# Patient Record
Sex: Female | Born: 1980 | Race: White | Hispanic: Yes | Marital: Married
Health system: Southern US, Community
[De-identification: ages and names within clinical notes are randomized; demographics above are authoritative.]

---

## 1999-12-27 ENCOUNTER — Ambulatory Visit (HOSPITAL_COMMUNITY): Admission: RE | Admit: 1999-12-27 | Discharge: 1999-12-27 | Payer: Self-pay | Admitting: *Deleted

## 2000-01-24 ENCOUNTER — Inpatient Hospital Stay (HOSPITAL_COMMUNITY): Admission: AD | Admit: 2000-01-24 | Discharge: 2000-01-25 | Payer: Self-pay | Admitting: *Deleted

## 2002-04-15 ENCOUNTER — Inpatient Hospital Stay (HOSPITAL_COMMUNITY): Admission: AD | Admit: 2002-04-15 | Discharge: 2002-04-15 | Payer: Self-pay | Admitting: Obstetrics and Gynecology

## 2002-04-24 ENCOUNTER — Emergency Department (HOSPITAL_COMMUNITY): Admission: EM | Admit: 2002-04-24 | Discharge: 2002-04-25 | Payer: Self-pay | Admitting: Emergency Medicine

## 2002-04-24 ENCOUNTER — Encounter: Payer: Self-pay | Admitting: Emergency Medicine

## 2002-06-23 ENCOUNTER — Ambulatory Visit (HOSPITAL_COMMUNITY): Admission: RE | Admit: 2002-06-23 | Discharge: 2002-06-23 | Payer: Self-pay | Admitting: *Deleted

## 2002-11-23 ENCOUNTER — Inpatient Hospital Stay (HOSPITAL_COMMUNITY): Admission: AD | Admit: 2002-11-23 | Discharge: 2002-11-26 | Payer: Self-pay | Admitting: Family Medicine

## 2003-03-21 ENCOUNTER — Emergency Department (HOSPITAL_COMMUNITY): Admission: EM | Admit: 2003-03-21 | Discharge: 2003-03-21 | Payer: Self-pay

## 2003-05-29 ENCOUNTER — Inpatient Hospital Stay (HOSPITAL_COMMUNITY): Admission: EM | Admit: 2003-05-29 | Discharge: 2003-06-01 | Payer: Self-pay | Admitting: Emergency Medicine

## 2003-05-29 ENCOUNTER — Encounter: Payer: Self-pay | Admitting: Emergency Medicine

## 2003-05-30 ENCOUNTER — Encounter: Payer: Self-pay | Admitting: Emergency Medicine

## 2003-06-09 ENCOUNTER — Encounter: Admission: RE | Admit: 2003-06-09 | Discharge: 2003-06-09 | Payer: Self-pay | Admitting: Family Medicine

## 2004-09-14 ENCOUNTER — Ambulatory Visit: Payer: Self-pay | Admitting: Family Medicine

## 2004-09-14 ENCOUNTER — Ambulatory Visit: Payer: Self-pay | Admitting: *Deleted

## 2005-05-14 ENCOUNTER — Ambulatory Visit: Payer: Self-pay | Admitting: Family Medicine

## 2005-10-29 ENCOUNTER — Ambulatory Visit: Payer: Self-pay | Admitting: Internal Medicine

## 2006-01-03 ENCOUNTER — Ambulatory Visit: Payer: Self-pay | Admitting: Internal Medicine

## 2006-03-26 ENCOUNTER — Emergency Department (HOSPITAL_COMMUNITY): Admission: EM | Admit: 2006-03-26 | Discharge: 2006-03-26 | Payer: Self-pay | Admitting: Emergency Medicine

## 2006-03-28 ENCOUNTER — Ambulatory Visit (HOSPITAL_COMMUNITY): Admission: RE | Admit: 2006-03-28 | Discharge: 2006-03-28 | Payer: Self-pay | Admitting: Obstetrics & Gynecology

## 2006-04-10 ENCOUNTER — Ambulatory Visit (HOSPITAL_COMMUNITY): Admission: RE | Admit: 2006-04-10 | Discharge: 2006-04-10 | Payer: Self-pay | Admitting: Family Medicine

## 2006-04-15 ENCOUNTER — Ambulatory Visit: Payer: Self-pay | Admitting: Obstetrics & Gynecology

## 2006-04-26 ENCOUNTER — Ambulatory Visit (HOSPITAL_COMMUNITY): Admission: RE | Admit: 2006-04-26 | Discharge: 2006-04-26 | Payer: Self-pay | Admitting: Family Medicine

## 2006-05-06 ENCOUNTER — Ambulatory Visit: Payer: Self-pay | Admitting: Family Medicine

## 2006-06-10 ENCOUNTER — Ambulatory Visit: Payer: Self-pay | Admitting: *Deleted

## 2006-06-17 ENCOUNTER — Ambulatory Visit: Payer: Self-pay | Admitting: Family Medicine

## 2006-07-04 ENCOUNTER — Ambulatory Visit: Payer: Self-pay | Admitting: Obstetrics & Gynecology

## 2006-07-25 ENCOUNTER — Ambulatory Visit: Payer: Self-pay | Admitting: Family Medicine

## 2006-08-08 ENCOUNTER — Ambulatory Visit: Payer: Self-pay | Admitting: Family Medicine

## 2006-08-15 ENCOUNTER — Ambulatory Visit: Payer: Self-pay | Admitting: *Deleted

## 2006-08-29 ENCOUNTER — Ambulatory Visit: Payer: Self-pay | Admitting: Obstetrics & Gynecology

## 2006-09-05 ENCOUNTER — Ambulatory Visit: Payer: Self-pay | Admitting: Family Medicine

## 2006-09-09 ENCOUNTER — Ambulatory Visit: Payer: Self-pay | Admitting: Obstetrics & Gynecology

## 2006-09-12 ENCOUNTER — Ambulatory Visit: Payer: Self-pay | Admitting: Family Medicine

## 2006-09-14 ENCOUNTER — Inpatient Hospital Stay (HOSPITAL_COMMUNITY): Admission: AD | Admit: 2006-09-14 | Discharge: 2006-09-16 | Payer: Self-pay | Admitting: Obstetrics and Gynecology

## 2006-09-14 ENCOUNTER — Ambulatory Visit: Payer: Self-pay | Admitting: Obstetrics and Gynecology

## 2007-01-28 ENCOUNTER — Ambulatory Visit: Payer: Self-pay | Admitting: Internal Medicine

## 2007-04-22 IMAGING — US US OB FOLLOW-UP
1 series · 13 of 28 positions shown · non-contrast
Comparison: 03/28/06.

CLINICAL DATA: Incomplete anatomic survey on 03/28/06.

 OBSTETRICAL ULTRASOUND RE-EVALUATION:

[Series 1: us ob follow-up · 0.31mm/px · 13 of 84 slices shown]
[im 4/84]
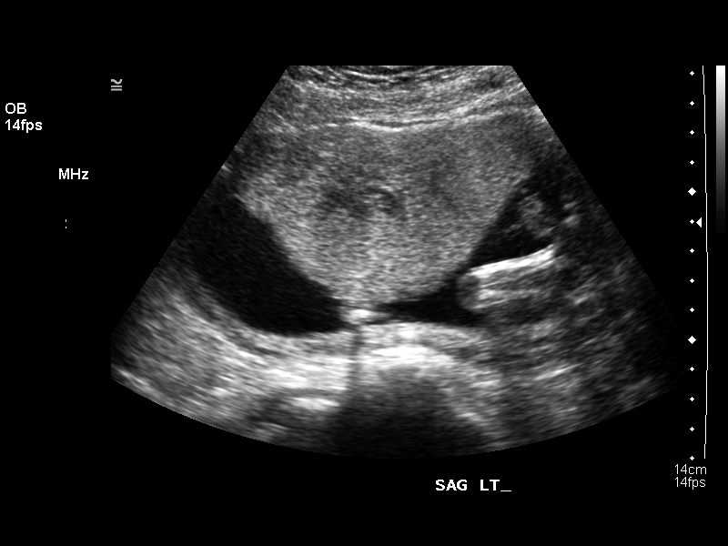
[im 10/84]
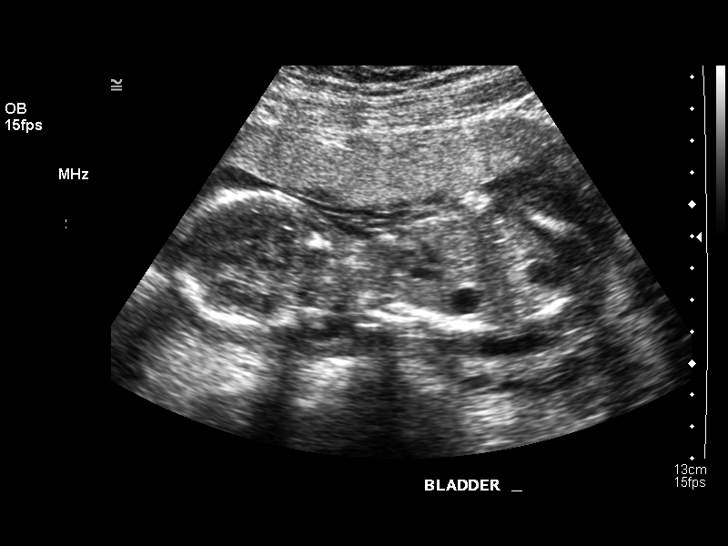
[im 16/84]
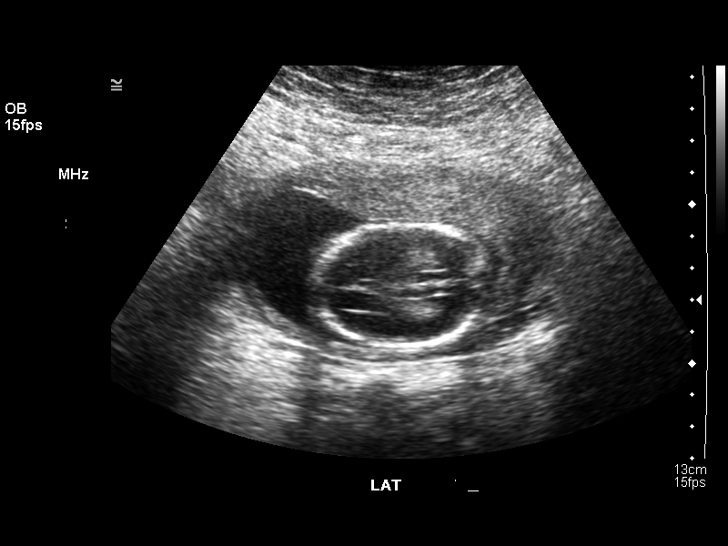
[im 22/84]
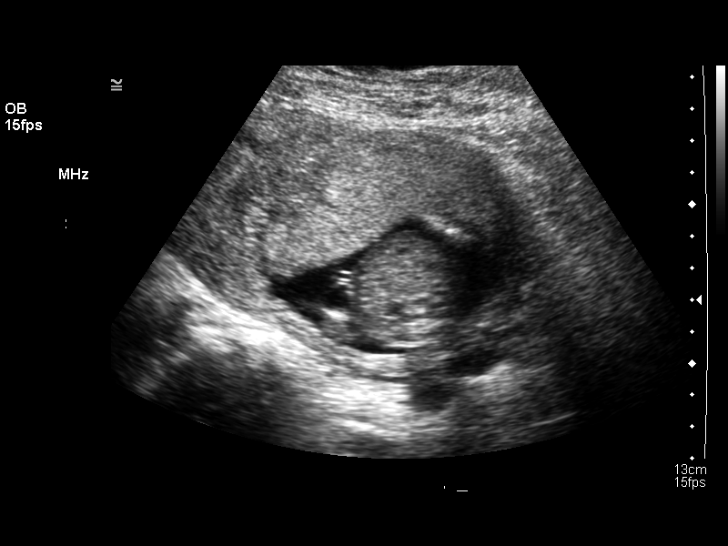
[im 28/84]
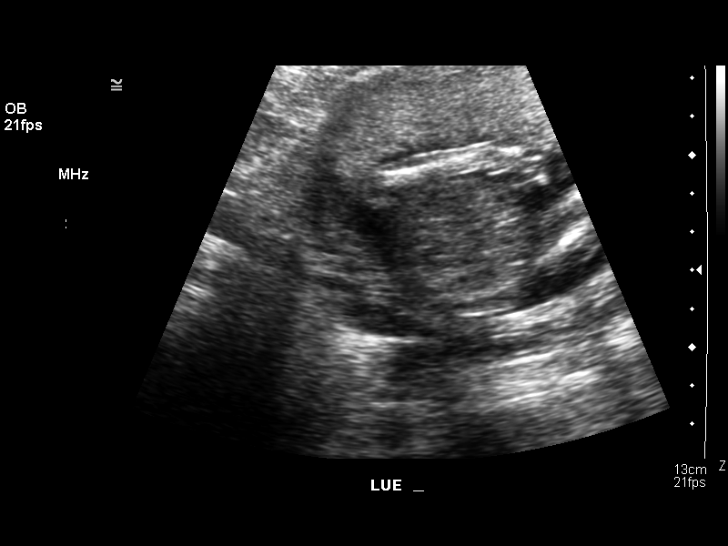
[im 34/84]
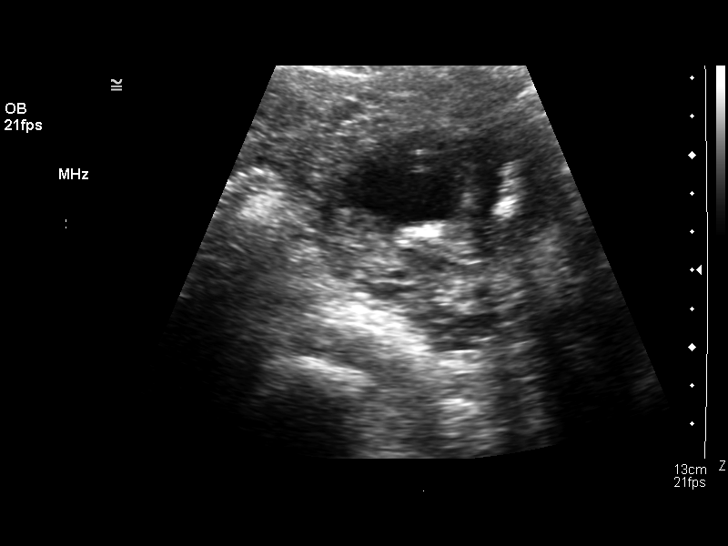
[im 44/84]
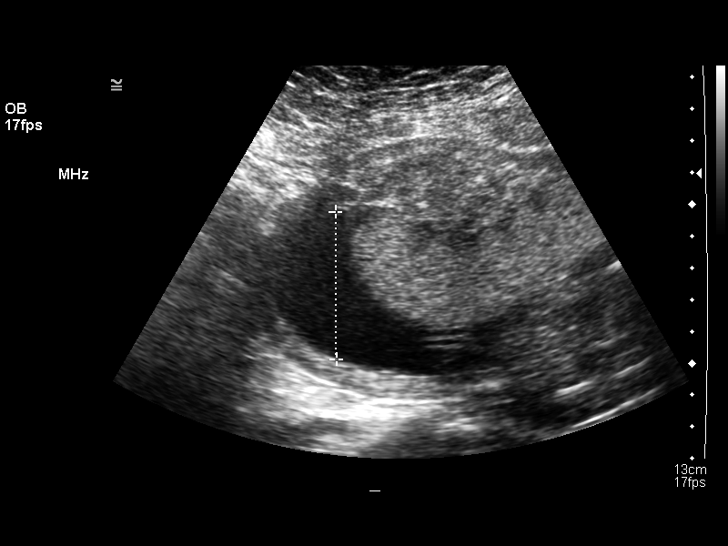
[im 50/84]
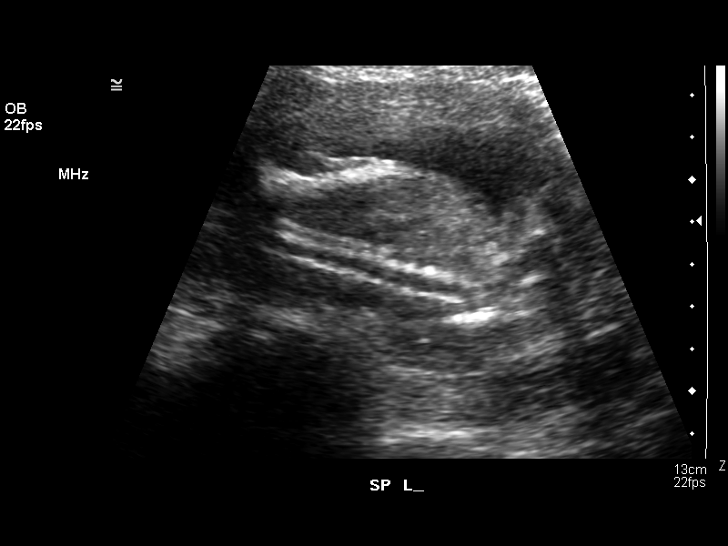
[im 56/84]
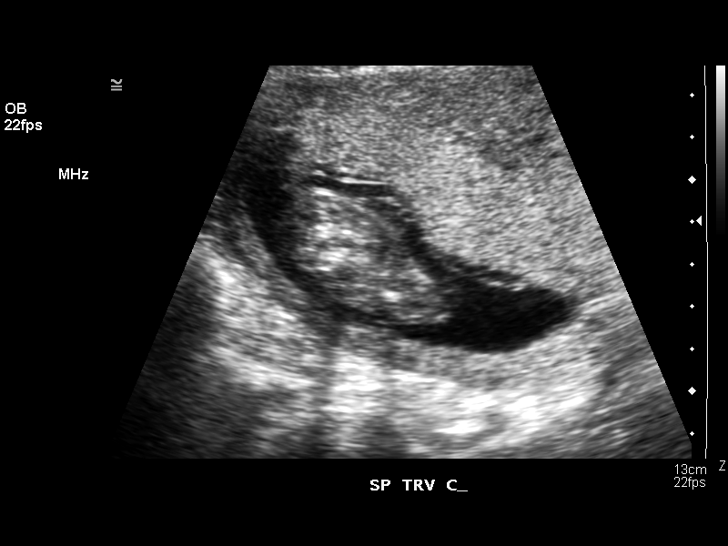
[im 62/84]
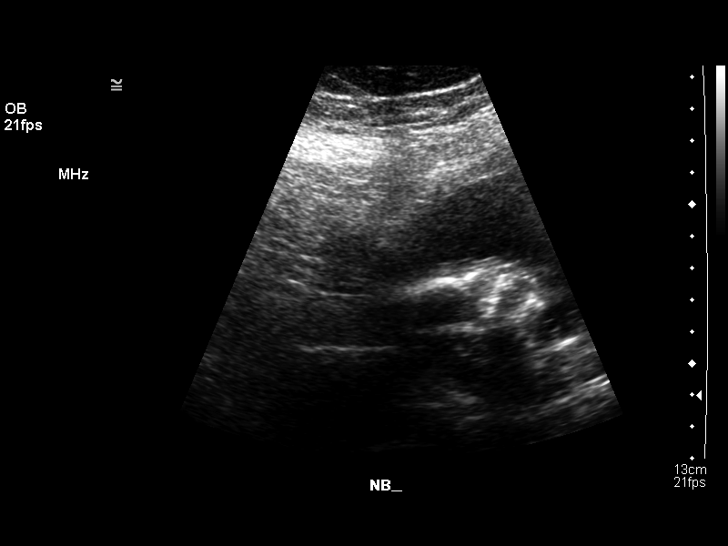
[im 68/84]
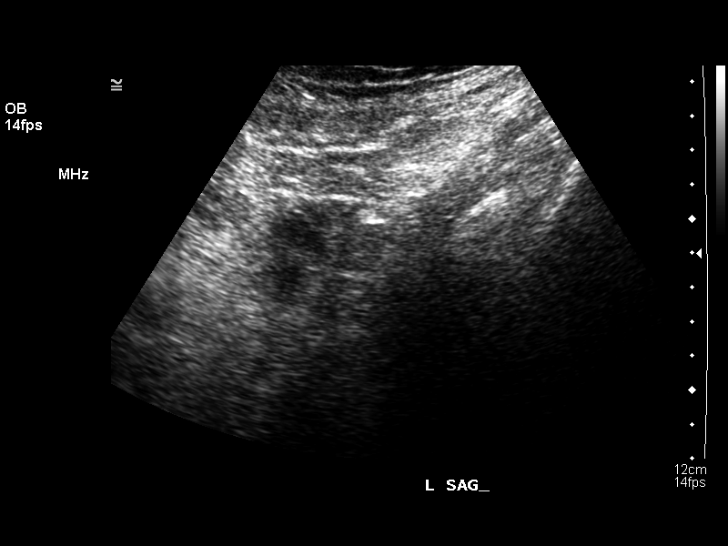
[im 74/84]
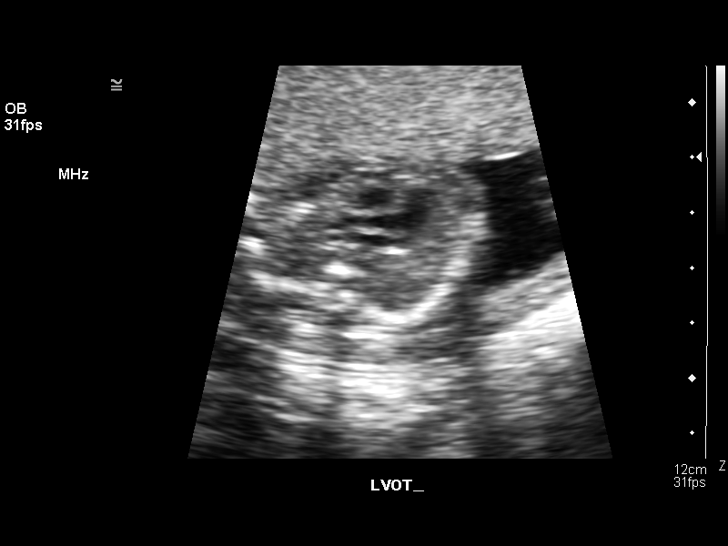
[im 80/84]
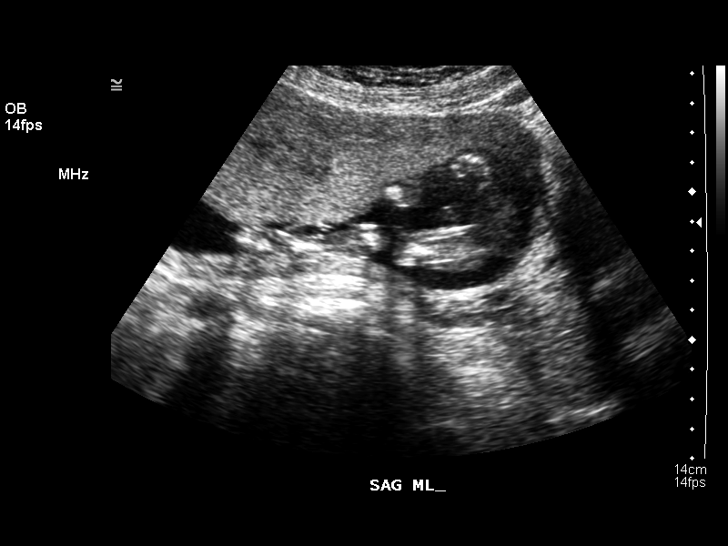

[13 of 28 positions shown; findings below may reference images not displayed]

Number of Fetuses:  1
 Heart Rate: 147 bpm
 Movement:  Yes
 Breathing:  Yes
 Presentation:  Breech
 Placental Location:  Anterior
 Grade: I
 Previa:  No
 Amniotic Fluid (subjective):  Normal
 Amniotic Fluid (objective):  4.6 cm vertical pocket 

 FETAL ANATOMY
 Lateral Ventricles:  Visualized  
 Thalami/CSP:  Visualized  
 Posterior Fossa:  Visualized   
 Nuchal Region:  Visualized 
 Spine:  Not visualized  
 4 Chamber Heart on Left:  Visualized  
 Stomach on Left:  Visualized  
 3 Vessel Cord:  Visualized 
 Cord Insertion site:  Visualized 
 Kidneys:  Visualized   
 Bladder:  Visualized   
 Extremities:  Visualized  

 ADDITIONAL ANATOMY VISUALIZED:  LVOT, RVOT, upper lip, orbits, diaphragm, heel, ductal arch, and aortic arch.

 Evaluation limited by:  Maternal habitus and fetal position.

   MATERNAL UTERINE AND ADNEXAL FINDINGS
 Cervix:  4.3 cm transabdominally.
IMPRESSION: Single live intrauterine gestation again noted.  Anatomy visualized as above, although due to fetal position and maternal body habitus, the spine, profile, and fifth digits could not be visualized adequately today.  Follow-up in 2 weeks is recommended.

## 2007-04-23 ENCOUNTER — Encounter (INDEPENDENT_AMBULATORY_CARE_PROVIDER_SITE_OTHER): Payer: Self-pay | Admitting: *Deleted

## 2007-05-08 IMAGING — US US OB FOLLOW-UP
1 series · 13 of 28 positions shown · non-contrast
Comparison: none

CLINICAL DATA: 20 week 6 day assigned gestational age by prior ultrasound.  Evaluate fetal anatomy and growth.

[Series 1: us ob follow-up · 0.29mm/px · 13 of 81 slices shown]
[im 3/81]
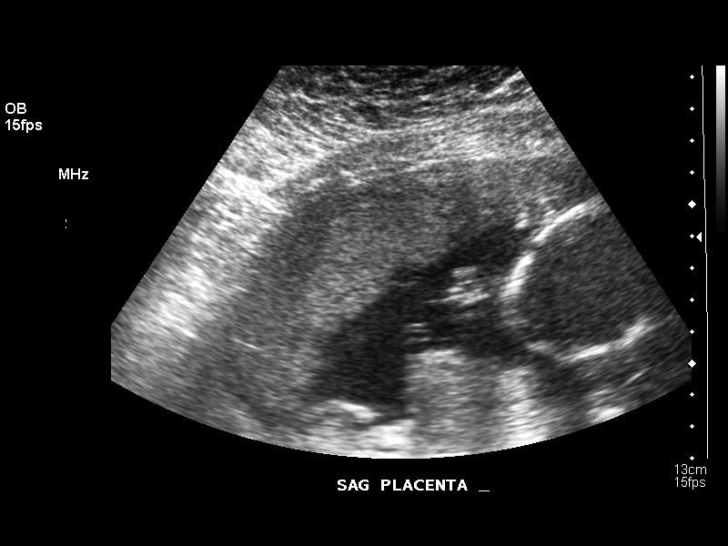
[im 9/81]
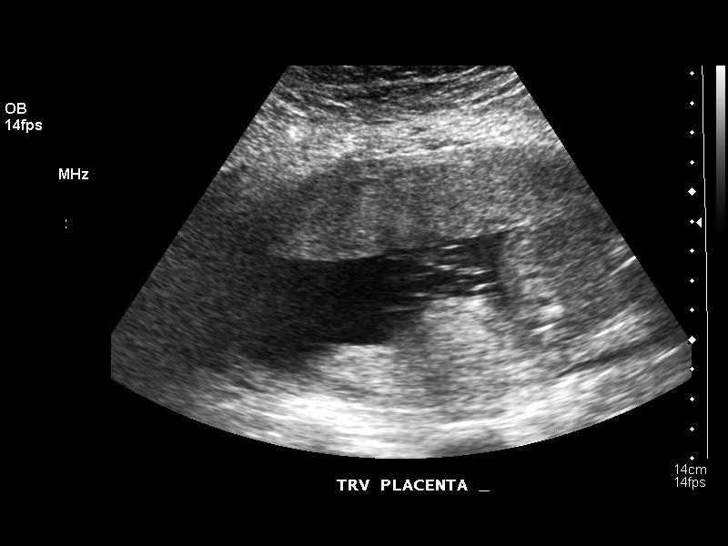
[im 15/81]
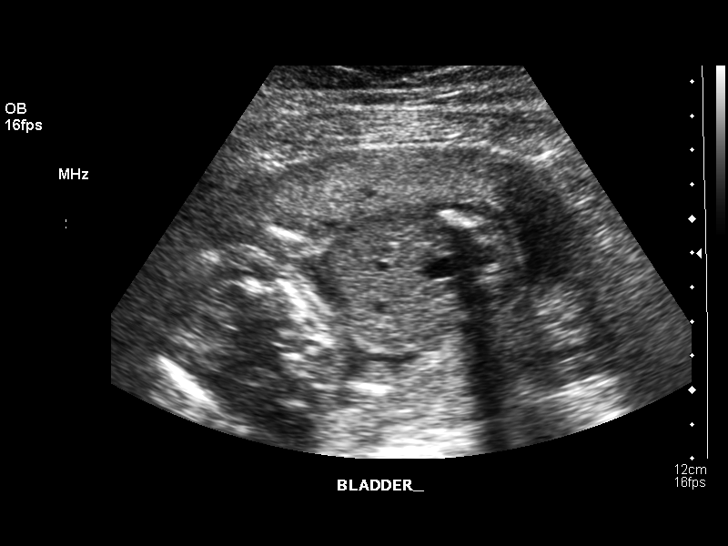
[im 21/81]
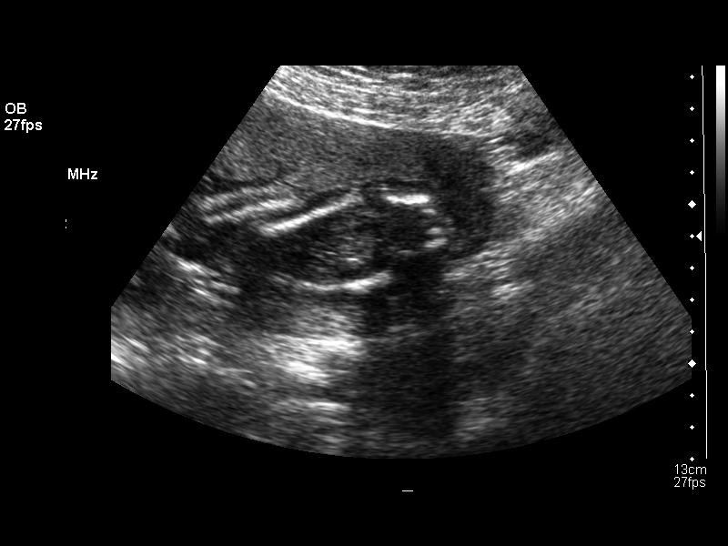
[im 27/81]
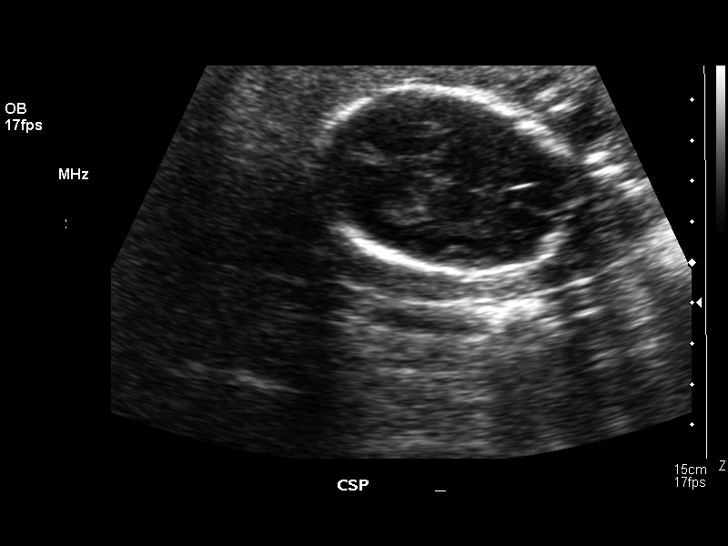
[im 33/81]
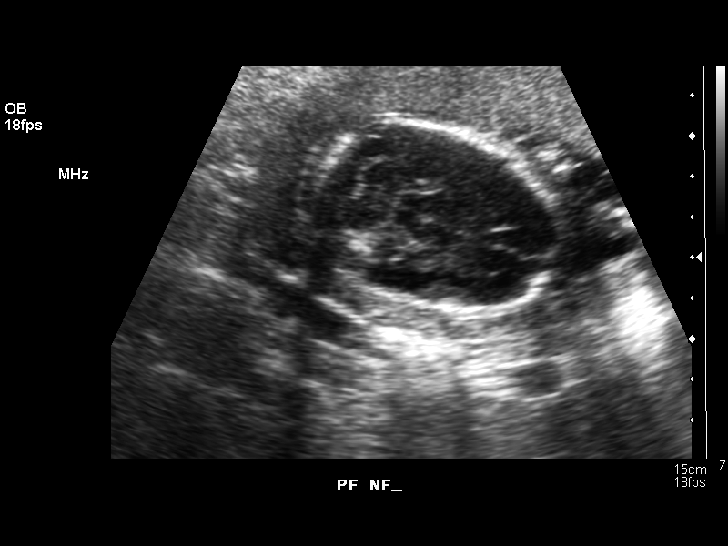
[im 42/81]
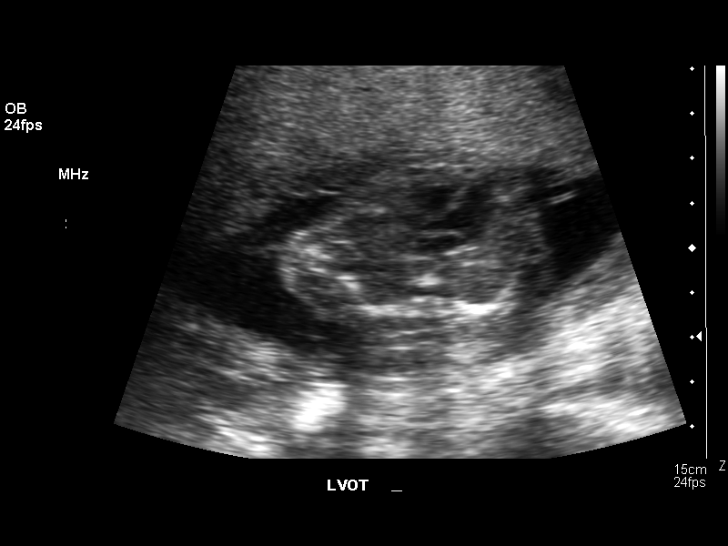
[im 48/81]
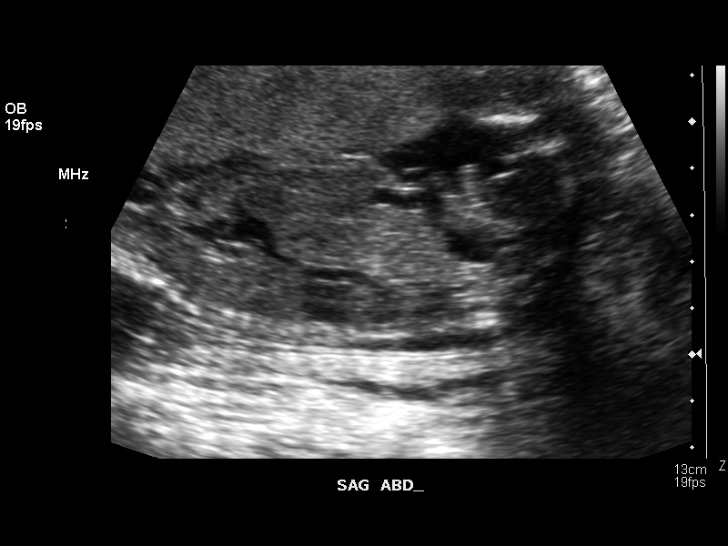
[im 54/81]
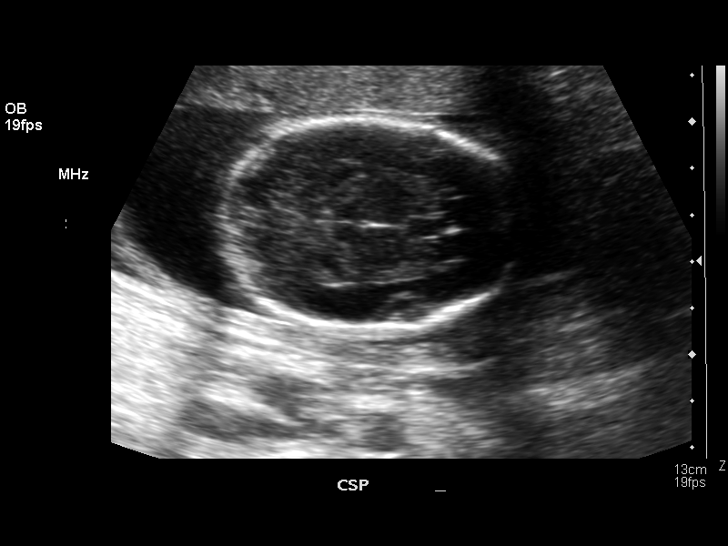
[im 60/81]
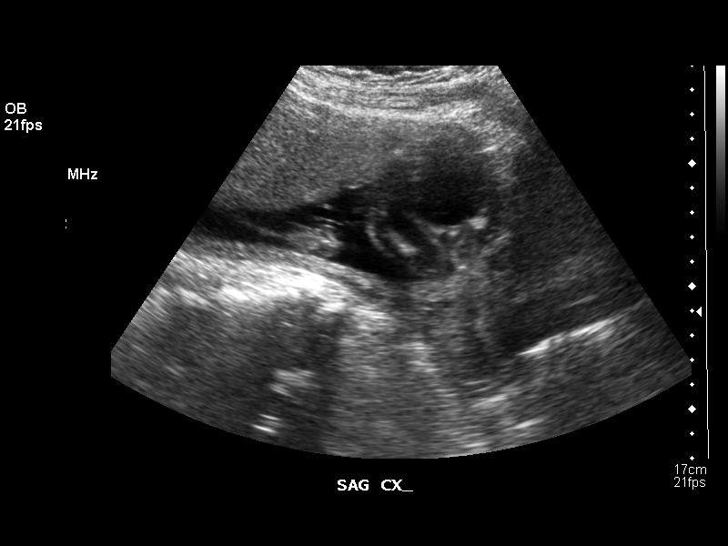
[im 66/81]
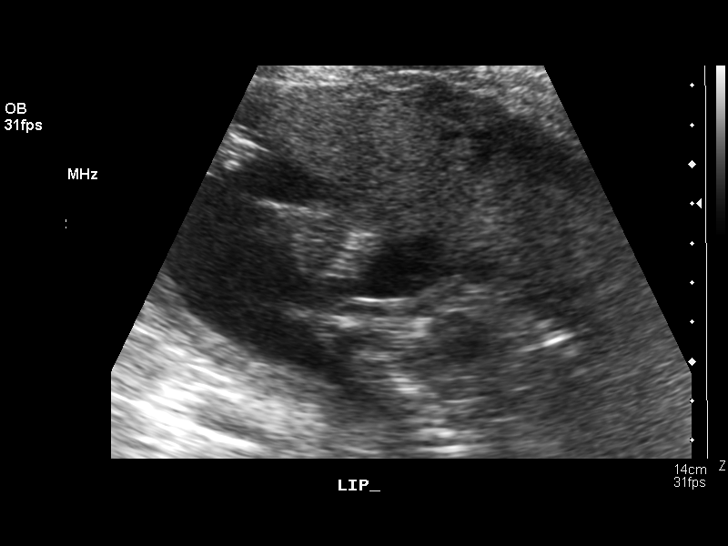
[im 72/81]
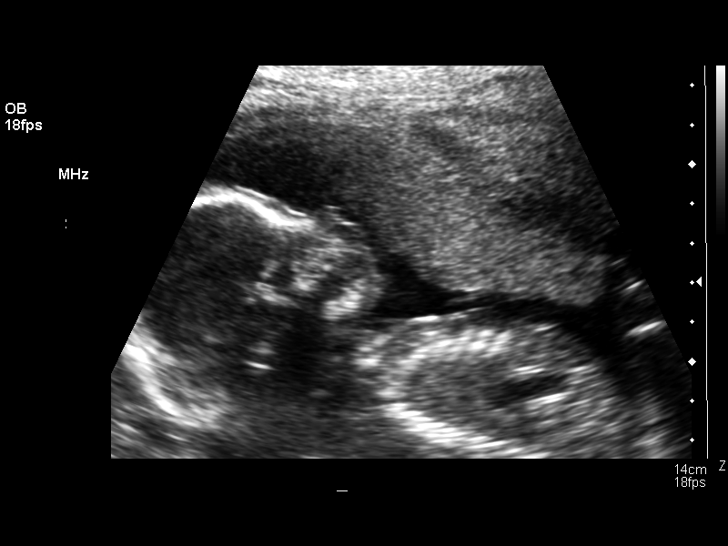
[im 78/81]
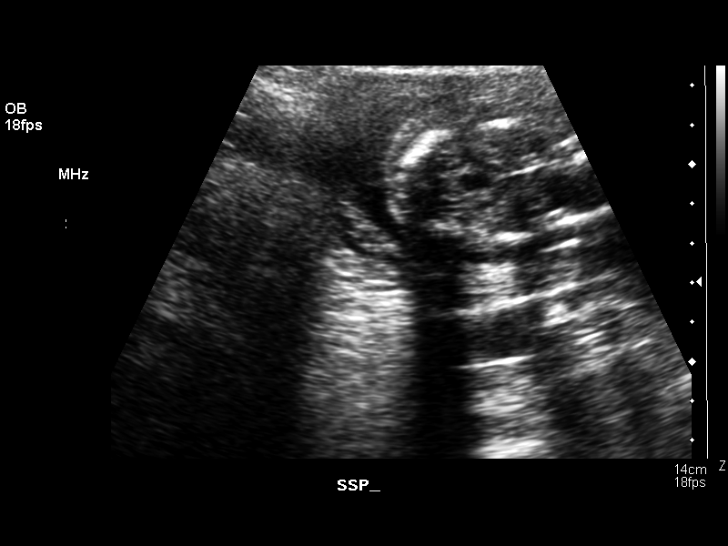

[13 of 28 positions shown; findings below may reference images not displayed]

OBSTETRICAL ULTRASOUND RE-EVALUATION:
 Number of Fetuses:  1
 Heart Rate:  141 bpm
 Movement:  Yes
 Breathing:  No
 Presentation:  Variable
 Placental Location:  Anterior
 Grade:  I
 Previa:  No
 Amniotic Fluid (subjective):  Normal
 Amniotic Fluid (objective):  3.7 cm vertical pocket

 FETAL BIOMETRY
 BPD:  7.6 cm    20 w  0 d
 HC:  17.9 cm  20 w  2 d
 AC:  15.8 cm  21 w  0 d
 FL:  3.3 cm  20 w  3 d

 Mean GA:   20 w  3 d    US EDC:  09/10/06
 Assigned GA:   20 w  6 d    Assigned EDC:  09/07/06

 Lateral Ventricles:    Visualized  
 Thalami/CSP:  Visualized  
 Posterior Fossa:    Visualized   
 Nuchal Region:  NF = 3.6 mm  Visualized 
 Spine:    Visualized  
 4 Chamber Heart on Left:  Visualized  
 Stomach on Left:    Visualized  
 3 Vessel Cord:    Visualized 
 Cord Insertion site:    Visualized 
 Kidneys:  Visualized   
 Bladder:  Visualized   
 Extremities:  Visualized  

 ADDITIONAL ANATOMY VISUALIZED: LVOT, RVOT, upper lip, orbits, profile, diaphragm, heel, 5th digit, ductal arch

 MATERNAL UTERINE AND ADNEXAL FINDINGS
 Cervix:  3.6 cm transabdominal
IMPRESSION: Assigned gestational age is currently 20 weeks 6 days by prior ultrasound.  Appropriate fetal growth.  No evidence of fetal anatomic abnormality.

## 2007-08-21 ENCOUNTER — Ambulatory Visit: Payer: Self-pay | Admitting: Internal Medicine

## 2008-03-13 ENCOUNTER — Inpatient Hospital Stay (HOSPITAL_COMMUNITY): Admission: AD | Admit: 2008-03-13 | Discharge: 2008-03-13 | Payer: Self-pay | Admitting: Obstetrics

## 2008-03-14 ENCOUNTER — Inpatient Hospital Stay (HOSPITAL_COMMUNITY): Admission: AD | Admit: 2008-03-14 | Discharge: 2008-03-15 | Payer: Self-pay | Admitting: Obstetrics

## 2010-12-22 NOTE — Discharge Summary (Signed)
Laura Richard, Laura Richard                         ACCOUNT NO.:  [id number]   MEDICAL RECORD NO.:  [id number]                   PATIENT TYPE:  INP   LOCATION:  3023                                 FACILITY:  [facility]   PHYSICIAN:  Penni Bombard, MD                    DATE OF BIRTH:  04/07/1981   DATE OF ADMISSION:  05/29/2003  DATE OF DISCHARGE:  06/01/2003                                 DISCHARGE SUMMARY   PRIMARY CARE PHYSICIAN:  [facility] or possibly [facility], the  patient states none.   REFERRING PHYSICIAN:  None.   CONSULTATIONS:  None.   FINAL DIAGNOSES:  1. Asthma exacerbation.  2. G3, P3, 0-0-3 female with recent vaginal delivery of her third child.   PRINCIPAL PROCEDURE:  None.   HISTORY AND PHYSICAL:  Please see the chart.   LABORATORY DATA:  The last CBC at the time of discharge was WBC 11.2, H&H  12.3 and 35.6, respectively and platelets of 266.   HOSPITAL COURSE:  Ms. Laura Richard is a 30 year old Hispanic female who presented  from [facility] secondary to wheezing and shortness of breath. She  states that she has had shortness of breath over the past week and that it  has worsened over the past day with increasing dyspnea and cough. She states  that she has clear sputum production approximately one week to presentation.  On presentation the patient had had two albuterol nebulizers and 120 mg of  Solu-Medrol and had peak flows in the 100s with O2 saturations of 90% on  room air. She was admitted originally to the ICU and started q.4h. albuterol  and Atrovent nebulizers with q.2h. p.r.n. available. The patient did well on  this, however, continued to have a very productive, at times very forceful  cough productive of a clear white sputum. She continued to be afebrile  throughout her hospital course, however, did have temperature elevations up  to approximately 99.3 at which time she would become extremely diaphoretic  and would feel excessively hot. The  patient was treated with Zithromax at  the start of her hospital course and received IV Solu-Medrol for the first  two days and then was switched over to 60 mg of oral prednisone. Throughout  her hospital stay her peak flows improved, her cough improved and at the  time of discharge her peak flow prior to treatment was 200, post was 320,  then pre and post done again at 11:55 revealed 290 and 370. The patient was  no longer wheezing and was moving air well in no respiratory distress. She  was afebrile at the time of discharge. She does state, however, that she  continues to have a very productive cough. The patient has continued to  breast feed her newborn child throughout her hospital course.   DISCHARGE INSTRUCTIONS:  The patient was instructed on the use of her  medications  and was encouraged to go to [facility] to pick up her  medications and also to go to [facility] or [facility] for follow-up  for primary care. I encouraged her to go to [facility] as this would  provide her with a primary care doctor and she has a [facility] card. The  patient was instructed to use her albuterol inhaler if she develops wheezing  and return to the hospital if this does not relieve her wheezing or  shortness of breath.    DISCHARGE MEDICATIONS:  1. Flovent 88 mcg 2 puffs b.i.d..  2. Prednisone 60 mg once a day x2 days. This will be a total of a five day     course of 60 mg.  3. Zithromax 250 mg one tab p.o. daily x2 days. This will give her a total     of five day course.  4. Albuterol 2 puffs every four hours as needed for rescue.  5. Guaifenesin 20 ml every 4 hours p.r.n. to help as an expectorant and help     with congestion.   DNR STATUS:  Full code.                                                Penni Bombard, MD    SJ/MEDQ  D:  06/01/2003  T:  06/01/2003  Job:  [id number]   cc:   [facility]

## 2011-05-04 LAB — CBC
Hemoglobin: 12.5
MCHC: 33.4
MCV: 95.3
Platelets: 248
RBC: 3.36 — ABNORMAL LOW
RBC: 3.94
RDW: 13.1
WBC: 11.3 — ABNORMAL HIGH

## 2011-05-04 LAB — TYPE AND SCREEN
ABO/RH(D): O POS
Antibody Screen: NEGATIVE

## 2011-05-04 LAB — DIFFERENTIAL
Lymphocytes Relative: 18
Lymphs Abs: 2
Monocytes Absolute: 0.9
Monocytes Relative: 8
Neutro Abs: 7.9 — ABNORMAL HIGH
Neutrophils Relative %: 70

## 2011-05-04 LAB — RUBELLA SCREEN

## 2011-05-04 LAB — RAPID HIV SCREEN (WH-MAU): Rapid HIV Screen: NONREACTIVE

## 2011-05-04 LAB — RPR: RPR Ser Ql: NONREACTIVE

## 2014-05-24 ENCOUNTER — Other Ambulatory Visit (HOSPITAL_COMMUNITY): Payer: Self-pay | Admitting: Physician Assistant

## 2014-05-24 DIAGNOSIS — Z3689 Encounter for other specified antenatal screening: Secondary | ICD-10-CM

## 2014-06-22 ENCOUNTER — Ambulatory Visit (HOSPITAL_COMMUNITY)
Admission: RE | Admit: 2014-06-22 | Discharge: 2014-06-22 | Disposition: A | Discharge status: Home / Self Care | Payer: Medicaid Other | Source: Ambulatory Visit | Attending: Physician Assistant | Admitting: Physician Assistant

## 2014-06-22 DIAGNOSIS — Z3A19 19 weeks gestation of pregnancy: Secondary | ICD-10-CM | POA: Diagnosis not present

## 2014-06-22 DIAGNOSIS — Z3689 Encounter for other specified antenatal screening: Secondary | ICD-10-CM

## 2014-06-22 DIAGNOSIS — Z36 Encounter for antenatal screening of mother: Secondary | ICD-10-CM | POA: Diagnosis not present
